# Patient Record
Sex: Male | Born: 1980 | Race: White | Hispanic: No | Marital: Married | Smoking: Current every day smoker
Health system: Southern US, Community
[De-identification: ages and names within clinical notes are randomized; demographics above are authoritative.]

## PROBLEM LIST (undated history)

## (undated) DIAGNOSIS — E669 Obesity, unspecified: Secondary | ICD-10-CM

## (undated) HISTORY — PX: OTHER SURGICAL HISTORY: SHX169

## (undated) HISTORY — PX: CHOLECYSTECTOMY: SHX55

---

## 2004-02-26 ENCOUNTER — Ambulatory Visit: Payer: Self-pay | Admitting: Family Medicine

## 2014-09-27 ENCOUNTER — Emergency Department: Payer: BLUE CROSS/BLUE SHIELD

## 2014-09-27 ENCOUNTER — Encounter: Payer: Self-pay | Admitting: Emergency Medicine

## 2014-09-27 ENCOUNTER — Other Ambulatory Visit: Payer: Self-pay

## 2014-09-27 ENCOUNTER — Emergency Department
Admission: EM | Admit: 2014-09-27 | Discharge: 2014-09-27 | Disposition: A | Discharge status: Home / Self Care | Payer: BLUE CROSS/BLUE SHIELD | Attending: Emergency Medicine | Admitting: Emergency Medicine

## 2014-09-27 DIAGNOSIS — Z87891 Personal history of nicotine dependence: Secondary | ICD-10-CM | POA: Diagnosis not present

## 2014-09-27 DIAGNOSIS — J209 Acute bronchitis, unspecified: Secondary | ICD-10-CM | POA: Insufficient documentation

## 2014-09-27 DIAGNOSIS — J40 Bronchitis, not specified as acute or chronic: Secondary | ICD-10-CM

## 2014-09-27 DIAGNOSIS — R05 Cough: Secondary | ICD-10-CM | POA: Diagnosis present

## 2014-09-27 LAB — CBC
HEMATOCRIT: 47.8 % (ref 40.0–52.0)
HEMOGLOBIN: 16.3 g/dL (ref 13.0–18.0)
MCH: 31.7 pg (ref 26.0–34.0)
MCHC: 34.2 g/dL (ref 32.0–36.0)
MCV: 92.7 fL (ref 80.0–100.0)
Platelets: 368 10*3/uL (ref 150–440)
RBC: 5.16 MIL/uL (ref 4.40–5.90)
RDW: 12.7 % (ref 11.5–14.5)
WBC: 14.6 10*3/uL — ABNORMAL HIGH (ref 3.8–10.6)

## 2014-09-27 LAB — BASIC METABOLIC PANEL
ANION GAP: 9 (ref 5–15)
BUN: 12 mg/dL (ref 6–20)
CALCIUM: 9.4 mg/dL (ref 8.9–10.3)
CHLORIDE: 105 mmol/L (ref 101–111)
CO2: 26 mmol/L (ref 22–32)
Creatinine, Ser: 1.02 mg/dL (ref 0.61–1.24)
GFR calc Af Amer: >60 mL/min (ref >60)
GFR calc non Af Amer: >60 mL/min (ref >60)
GLUCOSE: 140 mg/dL — AB (ref 65–99)
POTASSIUM: 4.1 mmol/L (ref 3.5–5.1)
Sodium: 140 mmol/L (ref 135–145)

## 2014-09-27 LAB — TROPONIN I: Troponin I: <0.03 ng/mL (ref <0.031)

## 2014-09-27 MED ORDER — AZITHROMYCIN 500 MG PO TABS: 500.0000 mg | ORAL_TABLET | Freq: Every day | ORAL | Status: AC

## 2014-09-27 MED ORDER — AZITHROMYCIN 250 MG PO TABS
500.0000 mg | ORAL_TABLET | Freq: Every day | ORAL | Status: DC
  Administered 2014-09-27: 500 mg via ORAL
  Filled 2014-09-27: qty 2

## 2014-09-27 NOTE — ED Notes (Signed)
Pt placed in room by xray.  

## 2014-09-27 NOTE — Discharge Instructions (Signed)
Take azithromycin once a day for the next 2 days. This 3 day course builds up in year blood stream and gives you antibiotic coverage for 10 days. Follow-up with her regular doctor next week if not feeling better. Return the emergency department if you feel worse or if you have other urgent concerns.

## 2014-09-27 NOTE — ED Provider Notes (Signed)
[facility] Emergency Department Provider Note  ____________________________________________  Time seen: 8 AM  I have reviewed the triage vital signs and the nursing notes.   HISTORY  Chief Complaint Chest Pain and Cough     HPI Jack Morrow is a 34 y.o. male who is complaining of a cough with mild shortness of breath. This is been present for approximately one week. It seems to have begun when he had what he describes as an allergy attack. The other symptoms of his allergy attack have resolved and now he has this lingering cough. He reports coughing up white frothy sputum and some green sputum. He reports that he has metallic taste in his mouth. This morning some of the sputum was blood tinged     History reviewed. No pertinent past medical history.  There are no active problems to display for this patient.   Past Surgical History  Procedure Laterality Date  . Cholecystectomy    . Right hand surgery    . Left knee surgery       Current Outpatient Rx  Name  Route  Sig  Dispense  Refill  . azithromycin (ZITHROMAX) 500 MG tablet   Oral   Take 1 tablet (500 mg total) by mouth daily. Take 1 tablet daily for 3 days.   2 tablet   0     Allergies Bee venom and Sulfa antibiotics  No family history on file.  Social History Social History  Substance Use Topics  . Smoking status: Former Smoker    Types: Cigarettes  . Smokeless tobacco: Former Neurosurgeon  . Alcohol Use: Yes     Comment: occasionally    Review of Systems  Constitutional: Negative for fever. ENT: Negative for sore throat. Cardiovascular: Negative for chest pain. Respiratory: Positive for cough and shortness of breath. Gastrointestinal: Negative for abdominal pain, vomiting and diarrhea. Genitourinary: Negative for dysuria. Musculoskeletal: No myalgias or injuries. Skin: Negative for rash. Neurological: Negative for headaches   10-point ROS otherwise  negative.  ____________________________________________   PHYSICAL EXAM:  VITAL SIGNS: ED Triage Vitals  Enc Vitals Group     BP 09/27/14 0733 126/90 mmHg     Pulse Rate 09/27/14 0733 105     Resp 09/27/14 0733 20     Temp 09/27/14 0733 98.1 F (36.7 C)     Temp Source 09/27/14 0733 Oral     SpO2 09/27/14 0733 97 %     Weight 09/27/14 0733 322 lb (146.058 kg)     Height 09/27/14 0733  (1.905 m)     Head Cir --      Peak Flow --      Pain Score 09/27/14 0734 5     Pain Loc --      Pain Edu? --      Excl. in GC? --     Constitutional: Alert and oriented. Well appearing and in no distress. ENT   Head: Normocephalic and atraumatic.   Nose: No congestion/rhinnorhea.   Mouth/Throat: Mucous membranes are moist. Cardiovascular: Normal rate, regular rhythm, no murmur noted Respiratory:  Normal respiratory effort, no tachypnea.    Breath sounds are clear and equal bilaterally.  Gastrointestinal: Soft and nontender. No distention.  Back: No muscle spasm, no tenderness, no CVA tenderness. Musculoskeletal: No deformity noted. Nontender with normal range of motion in all extremities.  No noted edema. Neurologic:  Normal speech and language. No gross focal neurologic deficits are appreciated.  Skin:  Skin is warm, dry.  No rash noted. Psychiatric: Mood and affect are normal. Speech and behavior are normal.  ____________________________________________    LABS (pertinent positives/negatives)  Labs Reviewed  BASIC METABOLIC PANEL - Abnormal; Notable for the following:    Glucose, Bld 140 (*)    All other components within normal limits  CBC - Abnormal; Notable for the following:    WBC 14.6 (*)    All other components within normal limits  TROPONIN I     ____________________________________________   EKG  ED ECG REPORT I, Geanna Divirgilio W, the attending physician, personally viewed and interpreted this ECG.   Date: 09/27/2014  EKG Time: 7:33 AM  Rate:  97  Rhythm: Normal sinus rhythm  Axis: Normal  Intervals: Normal  ST&T Change: None noted   ____________________________________________    RADIOLOGY  Chest x-ray: No focal infiltrate.  I personally viewed this film and used my interpretation and my clinical decision making.  ____________________________________________ ____________________________________________   INITIAL IMPRESSION / ASSESSMENT AND PLAN / ED COURSE  Pertinent labs & imaging results that were available during my care of the patient were reviewed by me and considered in my medical decision making (see chart for details).  Overall well-appearing 34 year old male. He is slightly anxious about his condition, but otherwise appears stable and well. His chest x-ray shows minimal increased markings in both left and right bases. Due to his somewhat elevated white blood cell count and the blood and sputum he reports, I will put him on azithromycin.  ____________________________________________   FINAL CLINICAL IMPRESSION(S) / ED DIAGNOSES  Final diagnoses:  Bronchitis      Darien Ramus, MD 09/27/14 [phone]

## 2014-09-27 NOTE — ED Notes (Signed)
Pt reports cough for 1 week with along with SOB and chest tightness. Denies fever. Pt reports coughing of blood tinged sputum.

## 2015-08-10 ENCOUNTER — Other Ambulatory Visit: Payer: Self-pay | Admitting: Orthopedic Surgery

## 2015-08-10 DIAGNOSIS — M2392 Unspecified internal derangement of left knee: Secondary | ICD-10-CM

## 2015-08-14 ENCOUNTER — Ambulatory Visit (HOSPITAL_COMMUNITY)
Admission: RE | Admit: 2015-08-14 | Discharge: 2015-08-14 | Disposition: A | Discharge status: Home / Self Care | Payer: Managed Care, Other (non HMO) | Source: Ambulatory Visit | Attending: Orthopedic Surgery | Admitting: Orthopedic Surgery

## 2015-08-14 DIAGNOSIS — M23222 Derangement of posterior horn of medial meniscus due to old tear or injury, left knee: Secondary | ICD-10-CM | POA: Diagnosis not present

## 2015-08-14 DIAGNOSIS — M7652 Patellar tendinitis, left knee: Secondary | ICD-10-CM | POA: Insufficient documentation

## 2015-08-14 DIAGNOSIS — M2392 Unspecified internal derangement of left knee: Secondary | ICD-10-CM | POA: Diagnosis present

## 2015-08-14 DIAGNOSIS — M25462 Effusion, left knee: Secondary | ICD-10-CM | POA: Insufficient documentation

## 2015-10-29 ENCOUNTER — Encounter
Admission: RE | Admit: 2015-10-29 | Discharge: 2015-10-29 | Disposition: A | Discharge status: Home / Self Care | Payer: Managed Care, Other (non HMO) | Source: Ambulatory Visit | Attending: Orthopedic Surgery | Admitting: Orthopedic Surgery

## 2015-10-29 HISTORY — DX: Obesity, unspecified: E66.9

## 2015-10-29 NOTE — Patient Instructions (Signed)
  Your procedure is scheduled on: 11-05-15 Report to Same Day Surgery 2nd floor medical mall To find out your arrival time please call [phone] between 1PM - 3PM on 11-04-15  Remember: Instructions that are not followed completely may result in serious medical risk, up to and including death, or upon the discretion of your surgeon and anesthesiologist your surgery may need to be rescheduled.    _x___ 1. Do not eat food or drink liquids after midnight. No gum chewing or hard candies.     __x__ 2. No Alcohol for 24 hours before or after surgery.   __x__3. No Smoking for 24 prior to surgery.   ____  4. Bring all medications with you on the day of surgery if instructed.    __x__ 5. Notify your doctor if there is any change in your medical condition     (cold, fever, infections).     Do not wear jewelry, make-up, hairpins, clips or nail polish.  Do not wear lotions, powders, or perfumes. You may wear deodorant.  Do not shave 48 hours prior to surgery. Men may shave face and neck.  Do not bring valuables to the hospital.    [facility] any belongings or valuables.               Contacts, dentures or bridgework may not be worn into surgery.  Leave your suitcase in the car. After surgery it may be brought to your room.  For patients admitted to the hospital, discharge time is determined by your treatment team.   Patients discharged the day of surgery will not be allowed to drive home.    Please read over the following fact sheets that you were given:   [facility] and or MRSA Information   ____ Take these medicines the morning of surgery with A SIP OF WATER:    1. NONE  2.  3.  4.  5.  6.  ____Fleets enema or Magnesium Citrate as directed.   ____ Use CHG Soap or sage wipes as directed on instruction sheet   ____ Use inhalers on the day of surgery and bring to hospital day of surgery  ____ Stop metformin 2 days prior to  surgery    ____ Take 1/2 of usual insulin dose the night before surgery and none on the morning of surgery.   _X___ Stop aspirin or coumadin, or plavix-STOP EXCEDRIN NOW  x__ Stop Anti-inflammatories such as Advil, Aleve, Ibuprofen, Motrin, Naproxen,          Naprosyn, Goodies powders or aspirin products-STOP VIMOVO NOW-Ok to take Tylenol.   ____ Stop supplements until after surgery.    ____ Bring C-Pap to the hospital.

## 2015-11-05 ENCOUNTER — Ambulatory Visit: Payer: Managed Care, Other (non HMO) | Admitting: Anesthesiology

## 2015-11-05 ENCOUNTER — Ambulatory Visit
Admission: RE | Admit: 2015-11-05 | Discharge: 2015-11-05 | Disposition: A | Discharge status: Home / Self Care | Payer: Managed Care, Other (non HMO) | Source: Ambulatory Visit | Attending: Orthopedic Surgery | Admitting: Orthopedic Surgery

## 2015-11-05 ENCOUNTER — Encounter
Admission: RE | Disposition: A | Discharge status: Home / Self Care | Payer: Self-pay | Source: Ambulatory Visit | Attending: Orthopedic Surgery

## 2015-11-05 DIAGNOSIS — F17200 Nicotine dependence, unspecified, uncomplicated: Secondary | ICD-10-CM | POA: Insufficient documentation

## 2015-11-05 DIAGNOSIS — M13862 Other specified arthritis, left knee: Secondary | ICD-10-CM | POA: Diagnosis present

## 2015-11-05 DIAGNOSIS — Z825 Family history of asthma and other chronic lower respiratory diseases: Secondary | ICD-10-CM | POA: Diagnosis not present

## 2015-11-05 DIAGNOSIS — M2342 Loose body in knee, left knee: Secondary | ICD-10-CM | POA: Diagnosis present

## 2015-11-05 DIAGNOSIS — K219 Gastro-esophageal reflux disease without esophagitis: Secondary | ICD-10-CM | POA: Insufficient documentation

## 2015-11-05 DIAGNOSIS — Z6841 Body Mass Index (BMI) 40.0 and over, adult: Secondary | ICD-10-CM | POA: Insufficient documentation

## 2015-11-05 DIAGNOSIS — Z8249 Family history of ischemic heart disease and other diseases of the circulatory system: Secondary | ICD-10-CM | POA: Diagnosis not present

## 2015-11-05 DIAGNOSIS — Z882 Allergy status to sulfonamides status: Secondary | ICD-10-CM | POA: Diagnosis not present

## 2015-11-05 DIAGNOSIS — Z833 Family history of diabetes mellitus: Secondary | ICD-10-CM | POA: Insufficient documentation

## 2015-11-05 DIAGNOSIS — Z808 Family history of malignant neoplasm of other organs or systems: Secondary | ICD-10-CM | POA: Diagnosis not present

## 2015-11-05 DIAGNOSIS — M25862 Other specified joint disorders, left knee: Secondary | ICD-10-CM | POA: Diagnosis not present

## 2015-11-05 DIAGNOSIS — Z8261 Family history of arthritis: Secondary | ICD-10-CM | POA: Insufficient documentation

## 2015-11-05 DIAGNOSIS — Z91030 Bee allergy status: Secondary | ICD-10-CM | POA: Diagnosis not present

## 2015-11-05 HISTORY — PX: SYNOVECTOMY: SHX5180

## 2015-11-05 HISTORY — PX: KNEE ARTHROSCOPY WITH MEDIAL MENISECTOMY: SHX5651

## 2015-11-05 SURGERY — ARTHROSCOPY, KNEE, WITH MEDIAL MENISCECTOMY
Anesthesia: General | Site: Knee | Laterality: Left | Wound class: Clean

## 2015-11-05 MED ORDER — DEXAMETHASONE SODIUM PHOSPHATE 10 MG/ML IJ SOLN
INTRAMUSCULAR | Status: DC | PRN
  Administered 2015-11-05: 10 mg via INTRAVENOUS

## 2015-11-05 MED ORDER — FENTANYL CITRATE (PF) 100 MCG/2ML IJ SOLN
25.0000 ug | INTRAMUSCULAR | Status: DC | PRN
  Administered 2015-11-05 (×5): 25 ug via INTRAVENOUS

## 2015-11-05 MED ORDER — FENTANYL CITRATE (PF) 100 MCG/2ML IJ SOLN
INTRAMUSCULAR | Status: AC
  Administered 2015-11-05: 25 ug via INTRAVENOUS
  Filled 2015-11-05: qty 2

## 2015-11-05 MED ORDER — LIDOCAINE HCL (CARDIAC) 20 MG/ML IV SOLN
INTRAVENOUS | Status: DC | PRN
  Administered 2015-11-05: 30 mg via INTRAVENOUS

## 2015-11-05 MED ORDER — HYDROCODONE-ACETAMINOPHEN 7.5-325 MG PO TABS
ORAL_TABLET | ORAL | Status: AC
  Filled 2015-11-05: qty 1

## 2015-11-05 MED ORDER — ONDANSETRON HCL 4 MG/2ML IJ SOLN: 4.0000 mg | Freq: Once | INTRAMUSCULAR | Status: DC | PRN

## 2015-11-05 MED ORDER — BUPIVACAINE-EPINEPHRINE (PF) 0.5% -1:200000 IJ SOLN
INTRAMUSCULAR | Status: DC | PRN
  Administered 2015-11-05: 30 mL

## 2015-11-05 MED ORDER — FAMOTIDINE 20 MG PO TABS
ORAL_TABLET | ORAL | Status: AC
  Filled 2015-11-05: qty 1

## 2015-11-05 MED ORDER — HYDROCODONE-ACETAMINOPHEN 7.5-325 MG PO TABS
1.0000 | ORAL_TABLET | ORAL | Status: DC | PRN
  Administered 2015-11-05: 1 via ORAL

## 2015-11-05 MED ORDER — HYDROCODONE-ACETAMINOPHEN 7.5-325 MG PO TABS: 1.0000 | ORAL_TABLET | ORAL | 0 refills | Status: AC | PRN

## 2015-11-05 MED ORDER — FAMOTIDINE 20 MG PO TABS
20.0000 mg | ORAL_TABLET | Freq: Once | ORAL | Status: AC
  Administered 2015-11-05: 20 mg via ORAL

## 2015-11-05 MED ORDER — PROPOFOL 10 MG/ML IV BOLUS
INTRAVENOUS | Status: DC | PRN
  Administered 2015-11-05: 50 mg via INTRAVENOUS
  Administered 2015-11-05: 200 mg via INTRAVENOUS

## 2015-11-05 MED ORDER — DEXTROSE 5 % IV SOLN
3.0000 g | Freq: Once | INTRAVENOUS | Status: AC
  Administered 2015-11-05: 3 g via INTRAVENOUS
  Filled 2015-11-05: qty 3000

## 2015-11-05 MED ORDER — BUPIVACAINE-EPINEPHRINE (PF) 0.5% -1:200000 IJ SOLN
INTRAMUSCULAR | Status: AC
  Filled 2015-11-05: qty 30

## 2015-11-05 MED ORDER — ONDANSETRON HCL 4 MG/2ML IJ SOLN
INTRAMUSCULAR | Status: DC | PRN
  Administered 2015-11-05: 4 mg via INTRAVENOUS

## 2015-11-05 MED ORDER — SODIUM CHLORIDE FLUSH 0.9 % IV SOLN
INTRAVENOUS | Status: AC
  Filled 2015-11-05: qty 10

## 2015-11-05 MED ORDER — MIDAZOLAM HCL 2 MG/2ML IJ SOLN
INTRAMUSCULAR | Status: DC | PRN
  Administered 2015-11-05: 2 mg via INTRAVENOUS

## 2015-11-05 MED ORDER — FENTANYL CITRATE (PF) 100 MCG/2ML IJ SOLN
INTRAMUSCULAR | Status: DC | PRN
  Administered 2015-11-05 (×3): 50 ug via INTRAVENOUS

## 2015-11-05 MED ORDER — LACTATED RINGERS IV SOLN
INTRAVENOUS | Status: DC
  Administered 2015-11-05: 09:00:00 via INTRAVENOUS

## 2015-11-05 SURGICAL SUPPLY — 31 items
ARTHROWAND PARAGON T2 (SURGICAL WAND) ×3
BANDAGE ACE 4X5 VEL STRL LF (GAUZE/BANDAGES/DRESSINGS) IMPLANT
BANDAGE ELASTIC 4 LF NS (GAUZE/BANDAGES/DRESSINGS) ×3 IMPLANT
BLADE FULL RADIUS 3.5 (BLADE) IMPLANT
BLADE INCISOR PLUS 4.5 (BLADE) ×2 IMPLANT
BLADE SHAVER 4.5 DBL SERAT CV (CUTTER) IMPLANT
BLADE SHAVER 4.5X7 STR FR (MISCELLANEOUS) IMPLANT
BNDG CMPR MED 5X4 ELC HKLP NS (GAUZE/BANDAGES/DRESSINGS) ×1
CHLORAPREP W/TINT 26ML (MISCELLANEOUS) ×3 IMPLANT
CUFF TOURN 24 STER (MISCELLANEOUS) IMPLANT
CUFF TOURN 30 STER DUAL PORT (MISCELLANEOUS) ×2 IMPLANT
GAUZE SPONGE 4X4 12PLY STRL (GAUZE/BANDAGES/DRESSINGS) ×3 IMPLANT
GLOVE SURG SYN 9.0  PF PI (GLOVE) ×2
GLOVE SURG SYN 9.0 PF PI (GLOVE) ×1 IMPLANT
GOWN SRG 2XL LVL 4 RGLN SLV (GOWNS) ×1 IMPLANT
GOWN STRL NON-REIN 2XL LVL4 (GOWNS) ×3
GOWN STRL REUS W/ TWL LRG LVL3 (GOWN DISPOSABLE) ×2 IMPLANT
GOWN STRL REUS W/TWL LRG LVL3 (GOWN DISPOSABLE) ×6
IV LACTATED RINGER IRRG 3000ML (IV SOLUTION) ×6
IV LR IRRIG 3000ML ARTHROMATIC (IV SOLUTION) ×2 IMPLANT
KIT RM TURNOVER STRD PROC AR (KITS) ×3 IMPLANT
MANIFOLD NEPTUNE II (INSTRUMENTS) ×3 IMPLANT
PACK ARTHROSCOPY KNEE (MISCELLANEOUS) ×3 IMPLANT
SET TUBE SUCT SHAVER OUTFL 24K (TUBING) ×3 IMPLANT
SET TUBE TIP INTRA-ARTICULAR (MISCELLANEOUS) ×3 IMPLANT
SUT ETHILON 4-0 (SUTURE) ×3
SUT ETHILON 4-0 FS2 18XMFL BLK (SUTURE) ×1
SUTURE ETHLN 4-0 FS2 18XMF BLK (SUTURE) ×1 IMPLANT
TUBING ARTHRO INFLOW-ONLY STRL (TUBING) ×3 IMPLANT
WAND ARTHRO PARAGON T2 (SURGICAL WAND) IMPLANT
WAND HAND CNTRL MULTIVAC 50 (MISCELLANEOUS) ×3 IMPLANT

## 2015-11-05 NOTE — Discharge Instructions (Signed)
Minimize activity this weekend. Keep bandage clean and dry. If bandage slides of the leg remove entire bandage and cover 2 incisions with Band-Aids followed by rewrapping the Ace wrap only

## 2015-11-05 NOTE — Transfer of Care (Signed)
Immediate Anesthesia Transfer of Care Note  Patient: Suzanne BoronChristopher L Rusher  Procedure(s) Performed: Procedure(s): KNEE ARTHROSCOPY WITH LATERAL RELEASE AND REMOVAL OF LOOSE BODY (Left) PARTIAL SYNOVECTOMY (Left)  Patient Location: PACU  Anesthesia Type:General  Level of Consciousness: patient cooperative and lethargic  Airway & Oxygen Therapy: Patient Spontanous Breathing and Patient connected to face mask oxygen  Post-op Assessment: Report given to RN and Post -op Vital signs reviewed and stable  Post vital signs: Reviewed and stable  Last Vitals:  Vitals:   11/05/15 0849 11/05/15 1152  BP: 139/87 129/84  Pulse: 85 84  Resp: 18 15  Temp: (!) 36.1 C 36.2 C    Last Pain:  Vitals:   11/05/15 0849  TempSrc: Tympanic  PainSc: 7          Complications: No apparent anesthesia complications

## 2015-11-05 NOTE — Op Note (Signed)
11/05/2015  11:51 AM  PATIENT:  Jack Morrow  35 y.o. male  PRE-OPERATIVE DIAGNOSIS:  complex tear of medial meniscus, fat pad impingement left knee   POST-OPERATIVE DIAGNOSIS:  loose body, patellofemoral arthritis, fat bad impingement, left knee  PROCEDURE:  Procedure(s): KNEE ARTHROSCOPY WITH LATERAL RELEASE AND REMOVAL OF LOOSE BODY (Left) PARTIAL SYNOVECTOMY (Left)  SURGEON: Leitha SchullerMichael J Satori Krabill, MD  ASSISTANTS: None  ANESTHESIA:   general  EBL:  No intake/output data recorded.  BLOOD ADMINISTERED:none  DRAINS: none   LOCAL MEDICATIONS USED:  MARCAINE     SPECIMEN:  No Specimen  DISPOSITION OF SPECIMEN:  N/A  COUNTS:  YES  TOURNIQUET:   9 minutes at 300 mmHg  IMPLANTS: None  DICTATION: .Dragon Dictation patient was brought to the operating room and after adequate general anesthesia was obtained, the left leg was prepped and draped in usual sterile fashion with an arthroscopic leg holder and tourniquet applied. After patient identification and timeout procedures were completed, an inferolateral portal was made and the arthroscope was introduced. 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 Inferior medial portal was made and on inspection of the medial compartment there was no meniscus tear identified with probing. There was partial-thickness articular cartilage loss approximately 1.5 cm in diameter some loose articular cartilage which was smoothed out with an ArthroCare wand. Anterior cruciate ligament was intact. Going to the lateral compartment the lateral compartment was relatively normal with no significant arthritis and intact meniscus in the medial compartment there were several small loose bodies noted and these were removed with use of a shaver. The knee in extension there was clearly fat pad impingement on the patellofemoral joint and this was debrided to remove synovium and fat  pad that impinged on the joint. Following this the ArthroCare wand was used to perform a lateral release of the tourniquet was raised first because of difficulty with visualization after shaving and having some bleeding from the fat pad. After lateral release the patella tracked better in the midline of the knee was irrigated until clear. The ArthriCare wand was used to obtain hemostasis and minimize postoperative bleeding. Argentation was withdrawn and the wounds closed simple 4-0 nylon. 30 cc half percent Sensorcaine infiltrated in the areas of the incision and lateral release. Xeroform 4 x 4's web roll and Ace wrap applied and patient center comes stable condition  PLAN OF CARE: Discharge to home after PACU  PATIENT DISPOSITION:  PACU - hemodynamically stable.

## 2015-11-05 NOTE — Anesthesia Preprocedure Evaluation (Signed)
Anesthesia Evaluation  Patient identified by MRN, date of birth, ID band Patient awake    Reviewed: Allergy & Precautions, NPO status , Patient's Chart, lab work & pertinent test results  History of Anesthesia Complications Negative for: history of anesthetic complications  Airway Mallampati: III       Dental   Pulmonary neg pulmonary ROS, Current Smoker,           Cardiovascular negative cardio ROS       Neuro/Psych negative neurological ROS     GI/Hepatic Neg liver ROS, GERD (occassional)  ,  Endo/Other  negative endocrine ROSMorbid obesity  Renal/GU negative Renal ROS     Musculoskeletal   Abdominal   Peds  Hematology negative hematology ROS (+)   Anesthesia Other Findings   Reproductive/Obstetrics                            Anesthesia Physical Anesthesia Plan  ASA: III  Anesthesia Plan: General   Post-op Pain Management:    Induction: Intravenous  Airway Management Planned: LMA  Additional Equipment:   Intra-op Plan:   Post-operative Plan:   Informed Consent: I have reviewed the patients History and Physical, chart, labs and discussed the procedure including the risks, benefits and alternatives for the proposed anesthesia with the patient or authorized representative who has indicated his/her understanding and acceptance.     Plan Discussed with:   Anesthesia Plan Comments:         Anesthesia Quick Evaluation

## 2015-11-05 NOTE — Anesthesia Procedure Notes (Signed)
Procedure Name: LMA Insertion Date/Time: 11/05/2015 10:54 AM Performed by: Jack Morrow, Jack Morrow Pre-anesthesia Checklist: Patient identified, Patient being monitored, Timeout performed, Emergency Drugs available and Suction available Patient Re-evaluated:Patient Re-evaluated prior to inductionOxygen Delivery Method: Circle system utilized Preoxygenation: Pre-oxygenation with 100% oxygen Intubation Type: IV induction Ventilation: Mask ventilation without difficulty LMA: LMA inserted LMA Size: 4.5 Tube type: Oral Number of attempts: 1 Placement Confirmation: positive ETCO2 and breath sounds checked- equal and bilateral Tube secured with: Tape Dental Injury: Teeth and Oropharynx as per pre-operative assessment

## 2015-11-05 NOTE — H&P (Signed)
Reviewed paper H+P, will be scanned into chart. No changes noted.  

## 2015-11-05 NOTE — Anesthesia Postprocedure Evaluation (Signed)
Anesthesia Post Note  Patient: Jack BoronChristopher L Morrow  Procedure(s) Performed: Procedure(s) (LRB): KNEE ARTHROSCOPY WITH LATERAL RELEASE AND REMOVAL OF LOOSE BODY (Left) PARTIAL SYNOVECTOMY (Left)  Patient location during evaluation: PACU Anesthesia Type: General Level of consciousness: awake and alert Pain management: pain level controlled Vital Signs Assessment: post-procedure vital signs reviewed and stable Respiratory status: spontaneous breathing and respiratory function stable Cardiovascular status: stable Anesthetic complications: no    Last Vitals:  Vitals:   11/05/15 1152 11/05/15 1207  BP: 129/84 133/82  Pulse: 84 79  Resp: 15 18  Temp: 36.2 C     Last Pain:  Vitals:   11/05/15 1207  TempSrc:   PainSc: Asleep                 Ayda Tancredi K

## 2015-11-06 ENCOUNTER — Encounter: Payer: Self-pay | Admitting: Orthopedic Surgery

## 2016-07-15 IMAGING — CR DG CHEST 2V
1 series · 2 of 2 positions shown · non-contrast
Comparison: 05/30/2012

CLINICAL DATA: Cough and shortness of breath for 1 week. Chest
discomfort.

EXAM:
CHEST  2 VIEW

[Series 1: dg chest 2 view · 0.14mm/px · 2 of 2 slices shown]
[im 1/2]
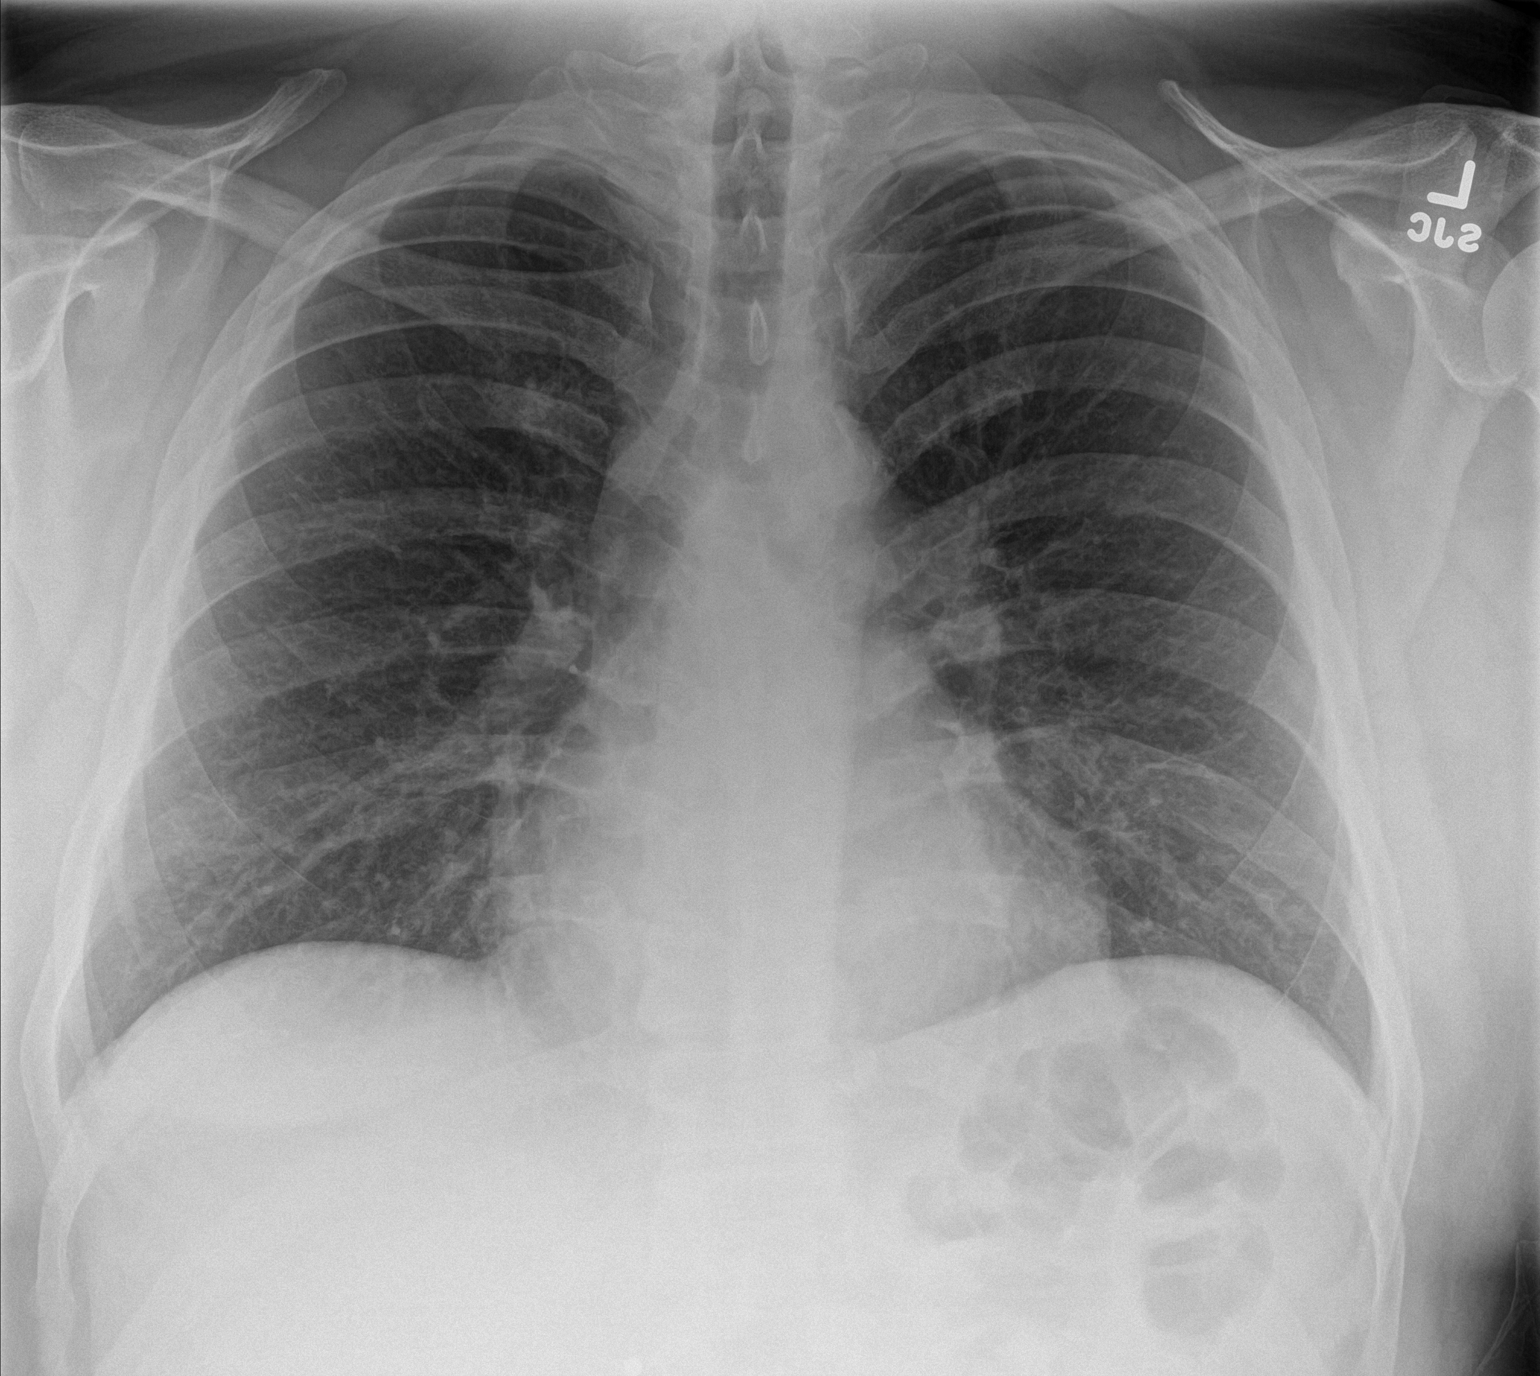
[im 2/2]
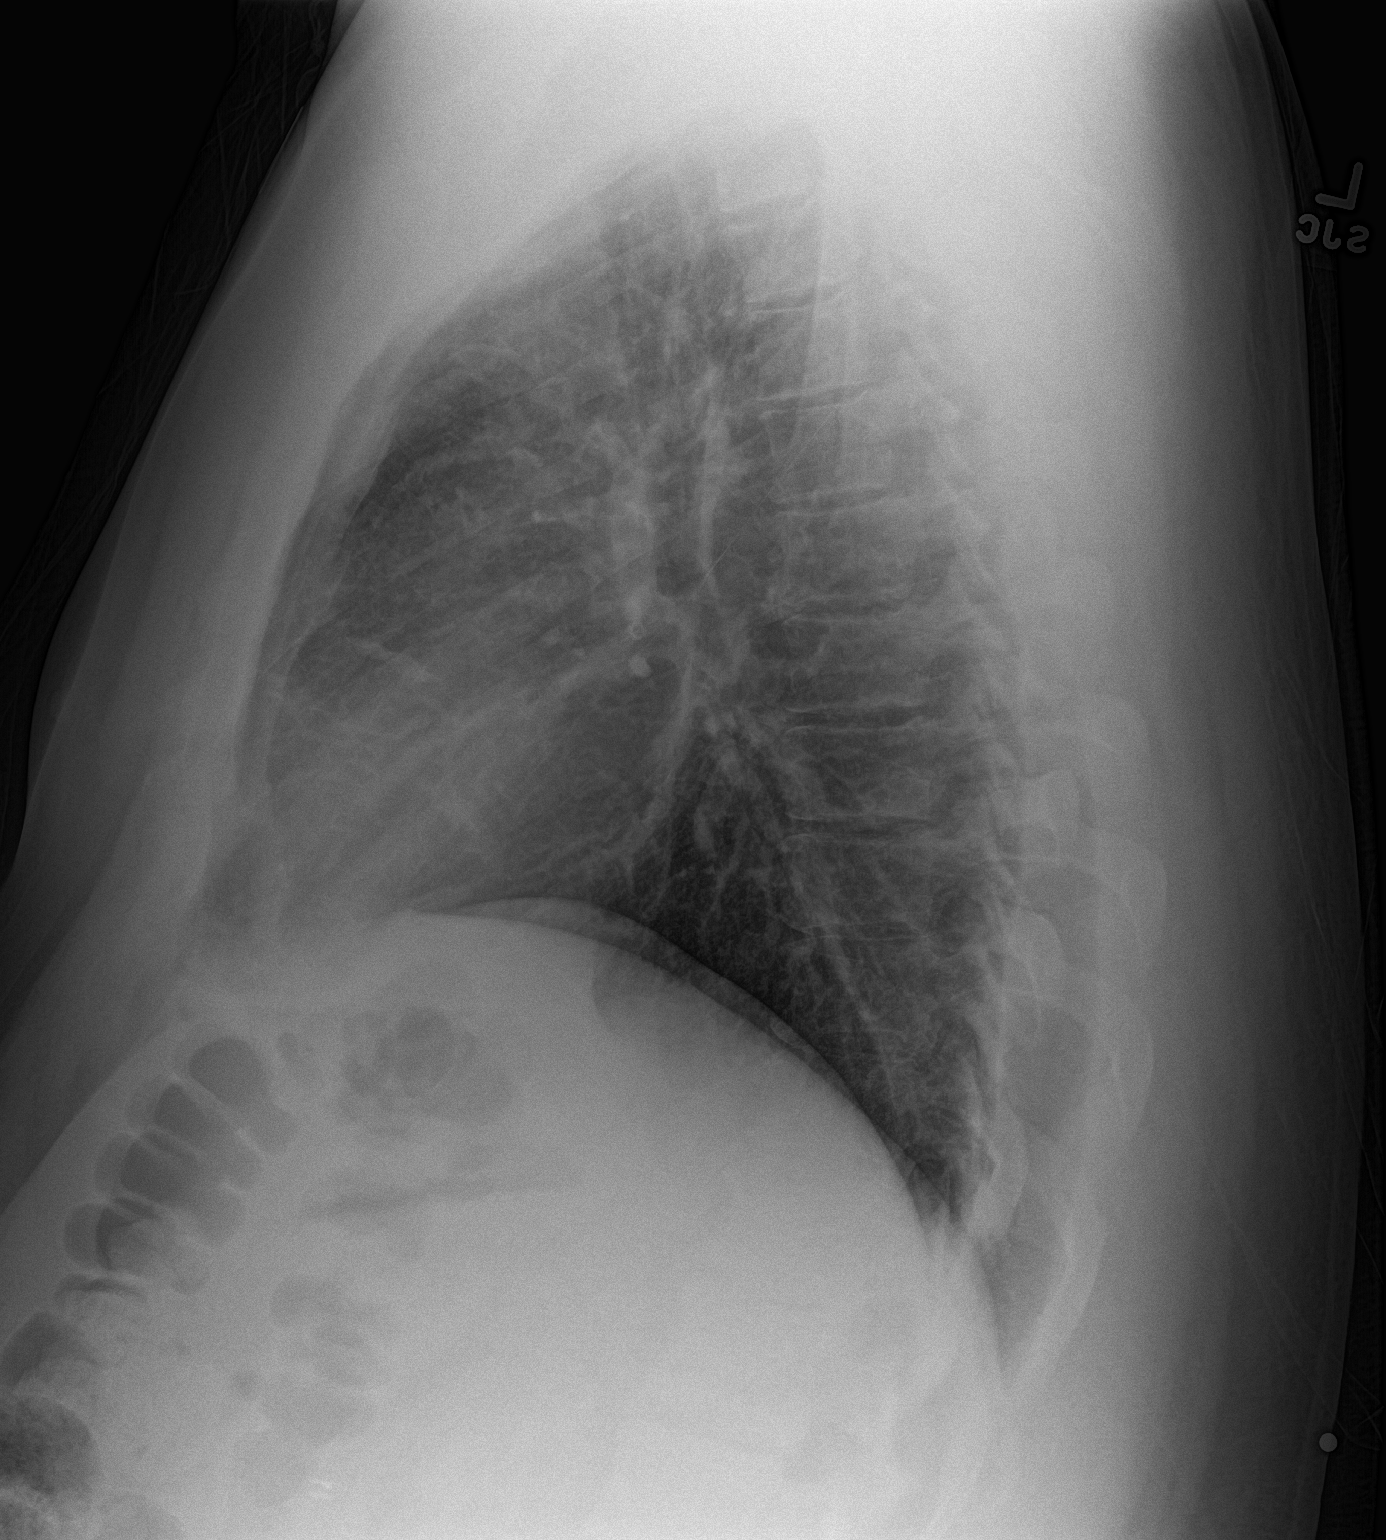

[2 of 2 positions shown; findings below may reference images not displayed]

FINDINGS: The heart size and mediastinal contours are within normal limits.
Both lungs are clear. The visualized skeletal structures are
unremarkable.
IMPRESSION: No active cardiopulmonary disease.

## 2021-10-27 ENCOUNTER — Other Ambulatory Visit: Payer: Self-pay | Admitting: Specialist

## 2021-10-27 DIAGNOSIS — M259 Joint disorder, unspecified: Secondary | ICD-10-CM

## 2021-11-17 ENCOUNTER — Ambulatory Visit
Admission: RE | Admit: 2021-11-17 | Discharge: 2021-11-17 | Disposition: A | Discharge status: Home / Self Care | Payer: Worker's Compensation | Source: Ambulatory Visit | Attending: Specialist | Admitting: Specialist

## 2021-11-17 DIAGNOSIS — M259 Joint disorder, unspecified: Secondary | ICD-10-CM
